# Patient Record
Sex: Male | Born: 1995 | Race: Black or African American | Hispanic: No | Marital: Single | Smoking: Never smoker
Health system: Southern US, Community
[De-identification: ages and names within clinical notes are randomized; demographics above are authoritative.]

---

## 2017-07-02 ENCOUNTER — Emergency Department (HOSPITAL_COMMUNITY)
Admission: EM | Admit: 2017-07-02 | Discharge: 2017-07-02 | Disposition: A | Discharge status: Home / Self Care | Payer: Self-pay | Attending: Emergency Medicine | Admitting: Emergency Medicine

## 2017-07-02 ENCOUNTER — Encounter (HOSPITAL_COMMUNITY): Payer: Self-pay | Admitting: Emergency Medicine

## 2017-07-02 DIAGNOSIS — R369 Urethral discharge, unspecified: Secondary | ICD-10-CM | POA: Insufficient documentation

## 2017-07-02 MED ORDER — CEFTRIAXONE SODIUM 250 MG IJ SOLR
250.0000 mg | Freq: Once | INTRAMUSCULAR | Status: AC
  Administered 2017-07-02: 250 mg via INTRAMUSCULAR
  Filled 2017-07-02: qty 250

## 2017-07-02 MED ORDER — LIDOCAINE HCL (PF) 1 % IJ SOLN
INTRAMUSCULAR | Status: AC
  Administered 2017-07-02: 0.9 mL
  Filled 2017-07-02: qty 5

## 2017-07-02 MED ORDER — AZITHROMYCIN 250 MG PO TABS
1000.0000 mg | ORAL_TABLET | Freq: Once | ORAL | Status: AC
  Administered 2017-07-02: 1000 mg via ORAL
  Filled 2017-07-02: qty 4

## 2017-07-02 NOTE — ED Provider Notes (Signed)
[facility] Note   CSN: [id number] Arrival date & time: 07/02/17  1717   History   Chief Complaint Chief Complaint  Patient presents with  . Penile Discharge    HPI Jesse SnareChristopher Hickman is a 21 y.o. male.  HPI    21 year old male reports 3 days of penile discharge and irritation. He denies any history of the same.  He reports his last sexual encounter was approximately 3 months ago with a male. Patient denies any abdominal pain fever nausea vomiting or any other systemic symptoms.   History reviewed. No pertinent past medical history.  There are no active problems to display for this patient.   History reviewed. No pertinent surgical history.     Home Medications    Prior to Admission medications   Not on File    Family History History reviewed. No pertinent family history.  Social History Social History   Tobacco Use  . Smoking status: Never Smoker  . Smokeless tobacco: Never Used  Substance Use Topics  . Alcohol use: No    Frequency: Never  . Drug use: No     Allergies   Patient has no allergy information on record.   Review of Systems Review of Systems  All other systems reviewed and are negative.    Physical Exam Updated Vital Signs BP (!) 143/71 (BP Location: Right Arm)   Pulse 78   Temp 98.1 F (36.7 C) (Oral)   Resp 17   Ht 5\' 8"  (1.727 m)   Wt 65.8 kg (145 lb)   SpO2 99%   BMI 22.05 kg/m   Physical Exam  Constitutional: He is oriented to person, place, and time. He appears well-developed and well-nourished.  HENT:  Head: Normocephalic and atraumatic.  Eyes: Conjunctivae are normal. Pupils are equal, round, and reactive to light. Right eye exhibits no discharge. Left eye exhibits no discharge. No scleral icterus.  Neck: Normal range of motion. No JVD present. No tracheal deviation present.  Pulmonary/Chest: Effort normal. No stridor.  Genitourinary:  Genitourinary Comments:  Purulent penile discharge no rashes  Neurological: He is alert and oriented to person, place, and time. Coordination normal.  Psychiatric: He has a normal mood and affect. His behavior is normal. Judgment and thought content normal.  Nursing note and vitals reviewed.    ED Treatments / Results  Labs (all labs ordered are listed, but only abnormal results are displayed) Labs Reviewed  RPR  HIV ANTIBODY (ROUTINE TESTING)  GC/CHLAMYDIA PROBE AMP ([place]) NOT AT [facility]    EKG  EKG Interpretation None       Radiology No results found.  Procedures Procedures (including critical care time)  Medications Ordered in ED Medications  cefTRIAXone (ROCEPHIN) injection 250 mg (250 mg Intramuscular Given 07/02/17 1931)  azithromycin (ZITHROMAX) tablet 1,000 mg (1,000 mg Oral Given 07/02/17 1930)  lidocaine (PF) (XYLOCAINE) 1 % injection (0.9 mLs  Given 07/02/17 1933)     Initial Impression / Assessment and Plan / ED Course  I have reviewed the triage vital signs and the nursing notes.  Pertinent labs & imaging results that were available during my care of the patient were reviewed by me and considered in my medical decision making (see chart for details).       Final Clinical Impressions(s) / ED Diagnoses   Final diagnoses:  Penile discharge   21 year old male presents today with likely STD. He is having purulent discharge. He'll be tested and treated appropriately. Return precautions  given. He verbalized understanding and agreement to today's plan.  ED Discharge Orders    None       Rosalio LoudHedges, Oretha Weismann, PA-C 07/02/17 2103    Benjiman CorePickering, Nathan, MD 07/02/17 [phone]

## 2017-07-02 NOTE — ED Triage Notes (Signed)
Pt to ER for evaluation of penile burning and white discharge onset Sunday. States not sexually active but reports frequent masturbation. States attempted to self treat with Vaseline. Pt in NAD. A/o x4

## 2017-07-02 NOTE — Discharge Instructions (Signed)
Please read attached information. If you experience any new or worsening signs or symptoms please return to the emergency room for evaluation. Please follow-up with your primary care provider or specialist as discussed.  °

## 2017-07-02 NOTE — ED Notes (Signed)
Patient given discharge instructions and verbalized understanding.  Patient stable to discharge at this time.  Patient is alert and oriented to baseline.  No distressed noted at this time.  All belongings taken with the patient at discharge.   

## 2017-07-03 LAB — RPR: RPR Ser Ql: NONREACTIVE

## 2017-07-03 LAB — HIV ANTIBODY (ROUTINE TESTING W REFLEX): HIV Screen 4th Generation wRfx: NONREACTIVE

## 2017-07-03 LAB — GC/CHLAMYDIA PROBE AMP (~~LOC~~) NOT AT ARMC
Chlamydia: POSITIVE — AB
Neisseria Gonorrhea: POSITIVE — AB

## 2017-12-06 ENCOUNTER — Encounter (HOSPITAL_COMMUNITY): Payer: Self-pay | Admitting: Family Medicine

## 2017-12-06 ENCOUNTER — Ambulatory Visit (HOSPITAL_COMMUNITY)
Admission: EM | Admit: 2017-12-06 | Discharge: 2017-12-06 | Disposition: A | Discharge status: Home / Self Care | Payer: Self-pay | Attending: Family Medicine | Admitting: Family Medicine

## 2017-12-06 DIAGNOSIS — H109 Unspecified conjunctivitis: Secondary | ICD-10-CM

## 2017-12-06 MED ORDER — GENTAMICIN SULFATE 0.3 % OP SOLN: 1.0000 [drp] | OPHTHALMIC | 0 refills | Status: DC

## 2017-12-06 NOTE — ED Provider Notes (Signed)
[facility]   [id number] 12/06/17 Arrival Time: 1645   SUBJECTIVE:  Jesse Hickman is a 22 y.o. male who presents to the urgent care with complaint of  Eye irritation on right side that started overnight  No contact use  Works in freezer with stocking food. History reviewed. No pertinent past medical history. No family history on file. Social History   Socioeconomic History  . Marital status: Single    Spouse name: Not on file  . Number of children: Not on file  . Years of education: Not on file  . Highest education level: Not on file  Occupational History  . Not on file  Social Needs  . Financial resource strain: Not on file  . Food insecurity:    Worry: Not on file    Inability: Not on file  . Transportation needs:    Medical: Not on file    Non-medical: Not on file  Tobacco Use  . Smoking status: Never Smoker  . Smokeless tobacco: Never Used  Substance and Sexual Activity  . Alcohol use: No    Frequency: Never  . Drug use: No  . Sexual activity: Not on file  Lifestyle  . Physical activity:    Days per week: Not on file    Minutes per session: Not on file  . Stress: Not on file  Relationships  . Social connections:    Talks on phone: Not on file    Gets together: Not on file    Attends religious service: Not on file    Active member of club or organization: Not on file    Attends meetings of clubs or organizations: Not on file    Relationship status: Not on file  . Intimate partner violence:    Fear of current or ex partner: Not on file    Emotionally abused: Not on file    Physically abused: Not on file    Forced sexual activity: Not on file  Other Topics Concern  . Not on file  Social History Narrative  . Not on file   No outpatient medications have been marked as taking for the 12/06/17 encounter [facility] Encounter).   No Known Allergies    ROS: As per HPI, remainder of ROS negative.   OBJECTIVE:   Vitals:   12/06/17 1724    BP: (!) 144/90  Pulse: 76  Resp: 20  Temp: 99.3 F (37.4 C)  TempSrc: Oral  SpO2: 99%     General appearance: alert; no distress Eyes: PERRL; EOMI; conjunctiva diffusely injected with swollen lower lid. HENT: normocephalic; atraumatic;oral mucosa normal  Neck: supple; no adenopathy Back: no CVA tenderness Extremities: no cyanosis or edema; symmetrical with no gross deformities Skin: warm and dry Neurologic: normal gait; grossly normal Psychological: alert and cooperative; normal mood and affect      Labs:  Results for orders placed or performed during the hospital encounter of 07/02/17  RPR  Result Value Ref Range   RPR Ser Ql Non Reactive Non Reactive  HIV antibody  Result Value Ref Range   HIV Screen 4th Generation wRfx Non Reactive Non Reactive  GC/Chlamydia probe amp ([place])not at [facility]  Result Value Ref Range   Chlamydia **POSITIVE** (A)    Neisseria gonorrhea **POSITIVE** (A)     Labs Reviewed - No data to display  No results found.     ASSESSMENT & PLAN:  1. Bacterial conjunctivitis of right eye     Meds ordered this encounter  Medications  .  gentamicin (GARAMYCIN) 0.3 % ophthalmic solution    Sig: Place 1 drop into the right eye every 4 (four) hours.    Dispense:  5 mL    Refill:  0    Reviewed expectations re: course of current medical issues. Questions answered. Outlined signs and symptoms indicating need for more acute intervention. Patient verbalized understanding. After Visit Summary given.    Procedures:      Elvina Sidle, MD 12/06/17 1729

## 2017-12-06 NOTE — ED Triage Notes (Signed)
Pt c/o R eye redness

## 2017-12-12 ENCOUNTER — Ambulatory Visit (HOSPITAL_COMMUNITY)
Admission: EM | Admit: 2017-12-12 | Discharge: 2017-12-12 | Disposition: A | Discharge status: Home / Self Care | Payer: Self-pay | Attending: Family Medicine | Admitting: Family Medicine

## 2017-12-12 ENCOUNTER — Encounter (HOSPITAL_COMMUNITY): Payer: Self-pay | Admitting: Emergency Medicine

## 2017-12-12 DIAGNOSIS — H1031 Unspecified acute conjunctivitis, right eye: Secondary | ICD-10-CM

## 2017-12-12 MED ORDER — AZITHROMYCIN 250 MG PO TABS
ORAL_TABLET | ORAL | Status: AC
  Filled 2017-12-12: qty 4

## 2017-12-12 MED ORDER — CEFTRIAXONE SODIUM 250 MG IJ SOLR
250.0000 mg | Freq: Once | INTRAMUSCULAR | Status: AC
  Administered 2017-12-12: 250 mg via INTRAMUSCULAR

## 2017-12-12 MED ORDER — CEFTRIAXONE SODIUM 250 MG IJ SOLR
INTRAMUSCULAR | Status: AC
Start: 2017-12-12 — End: ?
  Filled 2017-12-12: qty 250

## 2017-12-12 MED ORDER — AZITHROMYCIN 250 MG PO TABS
1000.0000 mg | ORAL_TABLET | Freq: Once | ORAL | Status: AC
  Administered 2017-12-12: 1000 mg via ORAL

## 2017-12-12 NOTE — ED Triage Notes (Signed)
PT's right eye is very red. Vision is blurry in affected eye. PT was seen here and prescribed gentamycin which did not help. 99.5 temp today.

## 2017-12-12 NOTE — Discharge Instructions (Addendum)
We are covering you for STD in the eye due to eye appearance. Eye culture also obtained for further testing. Azithromycin 1g by mouth and Rocephin  injection given in office today. Cytology sent, you will be contacted with any positive results that requires further treatment. Refrain from sexual activity and alcohol use for the next 7 days. If symptoms worsens during the weekend, please go to the emergency department for further evaluation needed. Otherwise, please follow up with ophthalmology for further evaluation and management needed.

## 2017-12-12 NOTE — ED Provider Notes (Signed)
[facility]    CSN: [id number] Arrival date & time: 12/12/17  1905     History   Chief Complaint Chief Complaint  Patient presents with  . Eye Problem    HPI Jesse Hickman is a 22 y.o. male.   22 year old male comes in for continued eye redness after being seen 12/06/2017.  At that time, he was treated for bacterial conjunctivitis with gentamicin.  States eyelid swelling has improved since then, but eye redness has continued with continued photophobia and eye irritation.  Vision is blurry on affected eye.  Denies crusting in the morning.  Denies contact lens, glasses use.    Last sexually active 2-3 months ago, 1 male partner, no condom use.      History reviewed. No pertinent past medical history.  There are no active problems to display for this patient.   History reviewed. No pertinent surgical history.     Home Medications    Prior to Admission medications   Medication Sig Start Date End Date Taking? Authorizing Provider  gentamicin (GARAMYCIN) 0.3 % ophthalmic solution Place 1 drop into the right eye every 4 (four) hours. 12/06/17  Yes Elvina Sidle, MD    Family History No family history on file.  Social History Social History   Tobacco Use  . Smoking status: Never Smoker  . Smokeless tobacco: Never Used  Substance Use Topics  . Alcohol use: No    Frequency: Never  . Drug use: No     Allergies   Patient has no known allergies.   Review of Systems Review of Systems  Reason unable to perform ROS: See HPI as above.     Physical Exam Triage Vital Signs ED Triage Vitals [12/12/17 1940]  Enc Vitals Group     BP 134/86     Pulse Rate 89     Resp 16     Temp 99.5 F (37.5 C)     Temp Source Oral     SpO2 100 %     Weight 145 lb (65.8 kg)     Height      Head Circumference      Peak Flow      Pain Score 6     Pain Loc      Pain Edu?      Excl. in GC?    No data found.  Updated Vital Signs BP 134/86   Pulse 89    Temp 99.5 F (37.5 C) (Oral)   Resp 16   Wt 145 lb (65.8 kg)   SpO2 100%   BMI 22.05 kg/m   Visual Acuity Right Eye Distance: 20/50 Left Eye Distance: 20/20 Bilateral Distance: 20/20  Right Eye Near:   Left Eye Near:    Bilateral Near:     Physical Exam  Constitutional: He is oriented to person, place, and time. He appears well-developed and well-nourished. No distress.  HENT:  Head: Normocephalic and atraumatic.  Eyes: Pupils are equal, round, and reactive to light. EOM and lids are normal. Lids are everted and swept, no foreign bodies found. Right conjunctiva is injected. Left conjunctiva is not injected.  Diffuse conjunctival injection, see picture below.  Patient with photophobia of right eye.  IOP (tonopen): 20 -- patient could not tolerate further tonopen.  Fluorescein stain without uptake.  Neck: Normal range of motion. Neck supple.  Neurological: He is alert and oriented to person, place, and time.  Skin: Skin is warm and dry.  UC Treatments / Results  Labs (all labs ordered are listed, but only abnormal results are displayed) Labs Reviewed  AEROBIC CULTURE (SUPERFICIAL SPECIMEN)  URINE CYTOLOGY ANCILLARY ONLY  CYTOLOGY, (ORAL, ANAL, URETHRAL) ANCILLARY ONLY    EKG None  Radiology No results found.  Procedures Procedures (including critical care time)  Medications Ordered in UC Medications  azithromycin (ZITHROMAX) tablet 1,000 mg (1,000 mg Oral Given 12/12/17 2058)  cefTRIAXone (ROCEPHIN) injection 250 mg (250 mg Intramuscular Given 12/12/17 2058)    Initial Impression / Assessment and Plan / UC Course  I have reviewed the triage vital signs and the nursing notes.  Pertinent labs & imaging results that were available during my care of the patient were reviewed by me and considered in my medical decision making (see chart for details).    Discussed case with Dr Lum Babe, who states concerns for [facility] of the conjunctiva. Cytology and  superficial culture obtained. Patient agreeable to empiric treatment of GC, azithromycin and rocephin given in office. Patient will need to follow up with ophthalmology for further evaluation and management needed. Strict return precautions given. Patient expresses understanding and agrees to plan.  Case discussed with Dr Marjorie Smolder, who agrees to plan.   Final Clinical Impressions(s) / UC Diagnoses   Final diagnoses:  Acute conjunctivitis of right eye, unspecified acute conjunctivitis type    ED Prescriptions    None        Belinda Fisher, PA-C 12/12/17 2111

## 2017-12-15 LAB — URINE CYTOLOGY ANCILLARY ONLY
CHLAMYDIA, DNA PROBE: NEGATIVE
Neisseria Gonorrhea: NEGATIVE
Trichomonas: NEGATIVE

## 2017-12-15 LAB — AEROBIC CULTURE  (SUPERFICIAL SPECIMEN)

## 2017-12-15 LAB — CYTOLOGY, (ORAL, ANAL, URETHRAL) ANCILLARY ONLY
CHLAMYDIA, DNA PROBE: NEGATIVE
NEISSERIA GONORRHEA: NEGATIVE

## 2017-12-15 LAB — AEROBIC CULTURE W GRAM STAIN (SUPERFICIAL SPECIMEN)
Culture: NO GROWTH
Gram Stain: NONE SEEN

## 2017-12-16 ENCOUNTER — Telehealth (HOSPITAL_COMMUNITY): Payer: Self-pay

## 2017-12-16 NOTE — Telephone Encounter (Signed)
All lab values are within normal limits. Pt contacted and made aware. Verbalized understanding.

## 2020-02-15 ENCOUNTER — Encounter (HOSPITAL_COMMUNITY): Payer: Self-pay

## 2020-02-15 ENCOUNTER — Ambulatory Visit (HOSPITAL_COMMUNITY)
Admission: EM | Admit: 2020-02-15 | Discharge: 2020-02-15 | Disposition: A | Discharge status: Home / Self Care | Payer: Managed Care, Other (non HMO) | Attending: Family Medicine | Admitting: Family Medicine

## 2020-02-15 ENCOUNTER — Other Ambulatory Visit: Payer: Self-pay

## 2020-02-15 DIAGNOSIS — Z202 Contact with and (suspected) exposure to infections with a predominantly sexual mode of transmission: Secondary | ICD-10-CM | POA: Diagnosis not present

## 2020-02-15 NOTE — Discharge Instructions (Addendum)
We will call you with any positive results of your swab. Follow up as needed for continued or worsening symptoms

## 2020-02-15 NOTE — ED Triage Notes (Signed)
Pt states he had unprotected sex last week and is concerned for possible exposure to STI.  Pt denies penile discharge, abdominal pain, dysuria sx. Pt states his penis has some skin is "peeling".

## 2020-02-16 LAB — CYTOLOGY, (ORAL, ANAL, URETHRAL) ANCILLARY ONLY
Chlamydia: POSITIVE — AB
Comment: NEGATIVE
Comment: NEGATIVE
Comment: NORMAL
Neisseria Gonorrhea: NEGATIVE
Trichomonas: NEGATIVE

## 2020-02-16 NOTE — ED Provider Notes (Signed)
[facility]    CSN: [id number] Arrival date & time: 02/15/20  1049      History   Chief Complaint Chief Complaint  Patient presents with  . Exposure to STD    HPI Jesse Hickman is a 24 y.o. male.   Pt is a 24 year old male that presents for STD screening.  Reports that he had unprotected sex last week and is concerned for possible stated STI.  Denies any penile discharge, abdominal pain, dysuria.     History reviewed. No pertinent past medical history.  There are no problems to display for this patient.   History reviewed. No pertinent surgical history.     Home Medications    Prior to Admission medications   Not on File    Family History Family History  Adopted: Yes    Social History Social History   Tobacco Use  . Smoking status: Never Smoker  . Smokeless tobacco: Never Used  Substance Use Topics  . Alcohol use: No  . Drug use: No     Allergies   Patient has no known allergies.   Review of Systems Review of Systems   Physical Exam Triage Vital Signs ED Triage Vitals  Enc Vitals Group     BP 02/15/20 1240 138/81     Pulse Rate 02/15/20 1240 70     Resp 02/15/20 1239 18     Temp 02/15/20 1239 98 F (36.7 C)     Temp Source 02/15/20 1239 Oral     SpO2 02/15/20 1240 100 %     Weight --      Height --      Head Circumference --      Peak Flow --      Pain Score 02/15/20 1240 0     Pain Loc --      Pain Edu? --      Excl. in GC? --    No data found.  Updated Vital Signs BP 138/81 (BP Location: Left Arm)   Pulse 70   Temp 98 F (36.7 C) (Oral)   Resp 18   SpO2 100%   Visual Acuity Right Eye Distance:   Left Eye Distance:   Bilateral Distance:    Right Eye Near:   Left Eye Near:    Bilateral Near:     Physical Exam Vitals and nursing note reviewed.  Constitutional:      Appearance: Normal appearance.  HENT:     Head: Normocephalic and atraumatic.     Nose: Nose normal.  Eyes:      Conjunctiva/sclera: Conjunctivae normal.  Pulmonary:     Effort: Pulmonary effort is normal.  Genitourinary:    Penis: Normal.   Musculoskeletal:        General: Normal range of motion.     Cervical back: Normal range of motion.  Skin:    General: Skin is warm and dry.  Neurological:     Mental Status: He is alert.  Psychiatric:        Mood and Affect: Mood normal.      UC Treatments / Results  Labs (all labs ordered are listed, but only abnormal results are displayed) Labs Reviewed  CYTOLOGY, (ORAL, ANAL, URETHRAL) ANCILLARY ONLY    EKG   Radiology No results found.  Procedures Procedures (including critical care time)  Medications Ordered in UC Medications - No data to display  Initial Impression / Assessment and Plan / UC Course  I have reviewed the triage  vital signs and the nursing notes.  Pertinent labs & imaging results that were available during my care of the patient were reviewed by me and considered in my medical decision making (see chart for details).     Exposure to STD Swab sent for testing.  Normal exam Final Clinical Impressions(s) / UC Diagnoses   Final diagnoses:  Exposure to STD     Discharge Instructions     We will call you with any positive results of your swab. Follow up as needed for continued or worsening symptoms     ED Prescriptions    None     PDMP not reviewed this encounter.   Dahlia Byes A, NP 02/16/20 0840

## 2020-02-18 ENCOUNTER — Telehealth (HOSPITAL_COMMUNITY): Payer: Self-pay

## 2020-02-18 MED ORDER — AZITHROMYCIN 250 MG PO TABS
1000.0000 mg | ORAL_TABLET | Freq: Once | ORAL | 0 refills | Status: AC
Start: 2020-02-18 — End: 2020-02-18

## 2020-03-31 ENCOUNTER — Encounter (HOSPITAL_COMMUNITY): Payer: Self-pay

## 2020-03-31 ENCOUNTER — Other Ambulatory Visit: Payer: Self-pay

## 2020-03-31 ENCOUNTER — Ambulatory Visit (HOSPITAL_COMMUNITY)
Admission: EM | Admit: 2020-03-31 | Discharge: 2020-03-31 | Disposition: A | Discharge status: Home / Self Care | Payer: Managed Care, Other (non HMO) | Attending: Physician Assistant | Admitting: Physician Assistant

## 2020-03-31 DIAGNOSIS — R369 Urethral discharge, unspecified: Secondary | ICD-10-CM | POA: Insufficient documentation

## 2020-03-31 DIAGNOSIS — Z8619 Personal history of other infectious and parasitic diseases: Secondary | ICD-10-CM | POA: Diagnosis present

## 2020-03-31 LAB — POCT URINALYSIS DIPSTICK, ED / UC
Bilirubin Urine: NEGATIVE
Glucose, UA: NEGATIVE mg/dL
Ketones, ur: NEGATIVE mg/dL
Nitrite: NEGATIVE
Protein, ur: NEGATIVE mg/dL
Specific Gravity, Urine: 1.02 (ref 1.005–1.030)
Urobilinogen, UA: >=8 mg/dL (ref 0.0–1.0)
pH: 7 (ref 5.0–8.0)

## 2020-03-31 MED ORDER — DOXYCYCLINE HYCLATE 100 MG PO CAPS: 100.0000 mg | ORAL_CAPSULE | Freq: Two times a day (BID) | ORAL | 0 refills | Status: AC

## 2020-03-31 NOTE — ED Provider Notes (Signed)
[facility]    CSN: [id number] Arrival date & time: 03/31/20  5643      History   Chief Complaint Chief Complaint  Jesse Hickman presents with  . penile discharge    HPI Jesse Hickman is a 24 y.o. male.   Presents for urethral discharge.  Discharge been present for the last 3 days.  He denies pain.  Denies testicular swelling or tenderness.  He reports he had a chlamydial infection on 02/15/2020, completed his azithromycin treatment at the time.  He has not been sexually active since then.  He does note changing soaps recently.  No fevers, chills.  No abdominal pain.     History reviewed. No pertinent past medical history.  There are no problems to display for this Jesse Hickman.   History reviewed. No pertinent surgical history.     Home Medications    Prior to Admission medications   Medication Sig Start Date End Date Taking? Authorizing Provider  doxycycline (VIBRAMYCIN) 100 MG capsule Take 1 capsule (100 mg total) by mouth 2 (two) times daily for 7 days. 03/31/20 04/07/20  Celestine Bougie, Veryl Speak, PA-C    Family History Family History  Adopted: Yes    Social History Social History   Tobacco Use  . Smoking status: Never Smoker  . Smokeless tobacco: Never Used  Substance Use Topics  . Alcohol use: No  . Drug use: No     Allergies   Jesse Hickman has no known allergies.   Review of Systems Review of Systems   Physical Exam Triage Vital Signs ED Triage Vitals  Enc Vitals Group     BP      Pulse      Resp      Temp      Temp src      SpO2      Weight      Height      Head Circumference      Peak Flow      Pain Score      Pain Loc      Pain Edu?      Excl. in GC?    No data found.  Updated Vital Signs BP 129/74   Pulse 63   Temp 98.3 F (36.8 C) (Oral)   Resp 18   Ht 5\' 8"  (1.727 m)   Wt 150 lb (68 kg)   SpO2 95%   BMI 22.81 kg/m   Visual Acuity Right Eye Distance:   Left Eye Distance:   Bilateral Distance:    Right Eye Near:     Left Eye Near:    Bilateral Near:     Physical Exam Vitals and nursing note reviewed.  Constitutional:      General: He is not in acute distress.    Appearance: Normal appearance. He is not ill-appearing.  Cardiovascular:     Rate and Rhythm: Normal rate.  Pulmonary:     Effort: Pulmonary effort is normal. No respiratory distress.  Genitourinary:    Comments: Clear with white streaking urethral discharge present at the meatus.  No skin lesions.  No testicular swelling or tenderness.  No inguinal adenopathy. Musculoskeletal:     Right lower leg: No edema.     Left lower leg: No edema.  Neurological:     Mental Status: He is alert.      UC Treatments / Results  Labs (all labs ordered are listed, but only abnormal results are displayed) Labs Reviewed  POCT URINALYSIS DIPSTICK, ED /  UC - Abnormal; Notable for the following components:      Result Value   Hgb urine dipstick TRACE (*)    Leukocytes,Ua SMALL (*)    All other components within normal limits  URINE CULTURE  CYTOLOGY, (ORAL, ANAL, URETHRAL) ANCILLARY ONLY    EKG   Radiology No results found.  Procedures Procedures (including critical care time)  Medications Ordered in UC Medications - No data to display  Initial Impression / Assessment and Plan / UC Course  I have reviewed the triage vital signs and the nursing notes.  Pertinent labs & imaging results that were available during my care of the Jesse Hickman were reviewed by me and considered in my medical decision making (see chart for details).     #Recent history of chlamydia #Urethral discharge Jesse Hickman is 24 year old with recent chlamydial infection presenting with urethral discharge.  UA with trace blood and leukocytes.  Culture sent.  Swab sent.  No he did receive treatment for the chlamydial infection, will start on doxycycline she received azithromycin previously.  Will defer other treatments pending results.  Discussed this with Jesse Hickman who  verbalized agreement and understanding that plan.  Encouraged him to follow-up with his primary care next week.  Jesse Hickman verbalized understanding plan of care. Final Clinical Impressions(s) / UC Diagnoses   Final diagnoses:  History of chlamydia  Urethral discharge in male     Discharge Instructions     Take the medication as prescribed  We have sent your tests we will call if we need to change anything  Continue to abstain from sexual intercourse , until all results return and treatments are complete  Minimize new soap products  Follow up with your primary care next week for check up     ED Prescriptions    Medication Sig Dispense Auth. Provider   doxycycline (VIBRAMYCIN) 100 MG capsule Take 1 capsule (100 mg total) by mouth 2 (two) times daily for 7 days. 14 capsule Zoriana Oats, Veryl Speak, PA-C     PDMP not reviewed this encounter.   Hermelinda Medicus, PA-C 03/31/20 [phone]

## 2020-03-31 NOTE — ED Triage Notes (Signed)
Pt c/o clear penile dischargex3 days. PT states completed meds for recent chlamydia dx and hasn't had sex since he took the meds.

## 2020-03-31 NOTE — Discharge Instructions (Signed)
Take the medication as prescribed  We have sent your tests we will call if we need to change anything  Continue to abstain from sexual intercourse , until all results return and treatments are complete  Minimize new soap products  Follow up with your primary care next week for check up

## 2020-04-03 LAB — CYTOLOGY, (ORAL, ANAL, URETHRAL) ANCILLARY ONLY
Chlamydia: POSITIVE — AB
Comment: NEGATIVE
Comment: NEGATIVE
Comment: NORMAL
Neisseria Gonorrhea: NEGATIVE
Trichomonas: NEGATIVE

## 2020-10-21 ENCOUNTER — Ambulatory Visit (INDEPENDENT_AMBULATORY_CARE_PROVIDER_SITE_OTHER): Payer: Self-pay

## 2020-10-21 ENCOUNTER — Ambulatory Visit (HOSPITAL_COMMUNITY)
Admission: EM | Admit: 2020-10-21 | Discharge: 2020-10-21 | Disposition: A | Discharge status: Home / Self Care | Payer: Self-pay | Attending: Urgent Care | Admitting: Urgent Care

## 2020-10-21 ENCOUNTER — Encounter (HOSPITAL_COMMUNITY): Payer: Self-pay

## 2020-10-21 ENCOUNTER — Other Ambulatory Visit: Payer: Self-pay

## 2020-10-21 DIAGNOSIS — S93402A Sprain of unspecified ligament of left ankle, initial encounter: Secondary | ICD-10-CM

## 2020-10-21 DIAGNOSIS — M25572 Pain in left ankle and joints of left foot: Secondary | ICD-10-CM

## 2020-10-21 DIAGNOSIS — M25472 Effusion, left ankle: Secondary | ICD-10-CM

## 2020-10-21 MED ORDER — NAPROXEN 500 MG PO TABS: 500.0000 mg | ORAL_TABLET | Freq: Two times a day (BID) | ORAL | 0 refills | Status: AC

## 2020-10-21 NOTE — ED Triage Notes (Signed)
Pt presents with left foot pain after stepping down on it the wrong way at work

## 2020-10-21 NOTE — ED Provider Notes (Signed)
[facility]   MRN: [id number] DOB: March 01, 1996  Subjective:   Jesse Hickman is a 25 y.o. male presenting for 1 day history of acute onset left ankle swelling, difficulty bearing weight.  Patient states that he was at work, stepped down off of the stepped the wrong way and rolled his ankle laterally.  He has been able to bear weight but has severe sharp pains intermittently.  Has not needed to take any medications for his pain.  Reports that it is otherwise mild.   No current facility-administered medications for this encounter. No current outpatient medications on file.   No Known Allergies  History reviewed. No pertinent past medical history.   History reviewed. No pertinent surgical history.  Family History  Adopted: Yes    Social History   Tobacco Use  . Smoking status: Never Smoker  . Smokeless tobacco: Never Used  Substance Use Topics  . Alcohol use: No  . Drug use: No    ROS   Objective:   Vitals: BP 127/69 (BP Location: Right Arm)   Pulse 63   Temp 97.8 F (36.6 C) (Oral)   Resp 18   SpO2 96%   Physical Exam Constitutional:      General: He is not in acute distress.    Appearance: Normal appearance. He is well-developed and normal weight. He is not ill-appearing, toxic-appearing or diaphoretic.  HENT:     Head: Normocephalic and atraumatic.     Right Ear: External ear normal.     Left Ear: External ear normal.     Nose: Nose normal.     Mouth/Throat:     Pharynx: Oropharynx is clear.  Eyes:     General: No scleral icterus.       Right eye: No discharge.        Left eye: No discharge.     Extraocular Movements: Extraocular movements intact.     Pupils: Pupils are equal, round, and reactive to light.  Cardiovascular:     Rate and Rhythm: Normal rate.  Pulmonary:     Effort: Pulmonary effort is normal.  Musculoskeletal:     Cervical back: Normal range of motion.     Left ankle: Swelling present. No deformity, ecchymosis  or lacerations. Tenderness present over the lateral malleolus. No medial malleolus, ATF ligament, AITF ligament, CF ligament, posterior TF ligament, base of 5th metatarsal or proximal fibula tenderness. Normal range of motion.     Left Achilles Tendon: No tenderness or defects. Thompson's test negative.  Neurological:     Mental Status: He is alert and oriented to person, place, and time.  Psychiatric:        Mood and Affect: Mood normal.        Behavior: Behavior normal.        Thought Content: Thought content normal.        Judgment: Judgment normal.     DG Ankle Complete Left  Result Date: 10/21/2020 CLINICAL DATA:  Ankle injury EXAM: LEFT ANKLE COMPLETE - 3+ VIEW COMPARISON:  None. FINDINGS: There is no evidence of fracture, dislocation, or joint effusion. There is no evidence of arthropathy or other focal bone abnormality. Soft tissues are unremarkable. IMPRESSION: Negative. Electronically Signed   By: Charlett Nose M.D.   On: 10/21/2020 16:30   Left ankle wrapped using 4" Ace wrap in figure-8 method.   Assessment and Plan :   PDMP not reviewed this encounter.  1. Sprain of left ankle, unspecified ligament, initial  encounter   2. Pain and swelling of left ankle     Will manage for ankle sprain with rice method, NSAID. Counseled patient on potential for adverse effects with medications prescribed/recommended today, ER and return-to-clinic precautions discussed, patient verbalized understanding.    Wallis Bamberg, [place] 10/21/20 1713

## 2021-04-09 ENCOUNTER — Encounter (HOSPITAL_COMMUNITY): Payer: Self-pay | Admitting: Emergency Medicine

## 2021-04-09 ENCOUNTER — Ambulatory Visit (HOSPITAL_COMMUNITY)
Admission: EM | Admit: 2021-04-09 | Discharge: 2021-04-09 | Disposition: A | Discharge status: Home / Self Care | Payer: Self-pay | Attending: Physician Assistant | Admitting: Physician Assistant

## 2021-04-09 ENCOUNTER — Other Ambulatory Visit: Payer: Self-pay

## 2021-04-09 DIAGNOSIS — Z113 Encounter for screening for infections with a predominantly sexual mode of transmission: Secondary | ICD-10-CM | POA: Insufficient documentation

## 2021-04-09 NOTE — ED Provider Notes (Signed)
[facility]    CSN: [id number] Arrival date & time: 04/09/21  1433      History   Chief Complaint Chief Complaint  Patient presents with   Erectile Dysfunction    HPI Jesse Hickman is a 25 y.o. male.   Pt presents for STD screening.  He reports he has not been having as many erections as normal and is concerned it may be due to STD.  Denies discharge, penile pain, dysuria, testicular pain/swelling.  He is a non smoker.    No past medical history on file.  There are no problems to display for this patient.   No past surgical history on file.     Home Medications    Prior to Admission medications   Medication Sig Start Date End Date Taking? Authorizing Provider  naproxen (NAPROSYN) 500 MG tablet Take 1 tablet (500 mg total) by mouth 2 (two) times daily with a meal. 10/21/20   Wallis Bamberg, PA-C    Family History Family History  Adopted: Yes    Social History Social History   Tobacco Use   Smoking status: Never   Smokeless tobacco: Never  Substance Use Topics   Alcohol use: No   Drug use: No     Allergies   Patient has no known allergies.   Review of Systems Review of Systems  Constitutional:  Negative for chills and fever.  HENT:  Negative for ear pain and sore throat.   Eyes:  Negative for pain and visual disturbance.  Respiratory:  Negative for cough and shortness of breath.   Cardiovascular:  Negative for chest pain and palpitations.  Gastrointestinal:  Negative for abdominal pain and vomiting.  Genitourinary:  Negative for dysuria, hematuria, penile discharge, penile pain, penile swelling, scrotal swelling and testicular pain.  Musculoskeletal:  Negative for arthralgias and back pain.  Skin:  Negative for color change and rash.  Neurological:  Negative for seizures and syncope.  All other systems reviewed and are negative.   Physical Exam Triage Vital Signs ED Triage Vitals [04/09/21 1537]  Enc Vitals Group     BP 132/83      Pulse Rate (!) 55     Resp 18     Temp      Temp src      SpO2 98 %     Weight      Height      Head Circumference      Peak Flow      Pain Score 0     Pain Loc      Pain Edu?      Excl. in GC?    No data found.  Updated Vital Signs BP 132/83 (BP Location: Right Arm)   Pulse (!) 55   Resp 18   SpO2 98%   Visual Acuity Right Eye Distance:   Left Eye Distance:   Bilateral Distance:    Right Eye Near:   Left Eye Near:    Bilateral Near:     Physical Exam Vitals and nursing note reviewed.  Constitutional:      Appearance: He is well-developed.  HENT:     Head: Normocephalic and atraumatic.  Eyes:     Conjunctiva/sclera: Conjunctivae normal.  Cardiovascular:     Rate and Rhythm: Normal rate and regular rhythm.     Heart sounds: No murmur heard. Pulmonary:     Effort: Pulmonary effort is normal. No respiratory distress.     Breath sounds: Normal breath sounds.  Abdominal:     Palpations: Abdomen is soft.     Tenderness: There is no abdominal tenderness.  Musculoskeletal:     Cervical back: Neck supple.  Skin:    General: Skin is warm and dry.  Neurological:     Mental Status: He is alert.     UC Treatments / Results  Labs (all labs ordered are listed, but only abnormal results are displayed) Labs Reviewed - No data to display  EKG   Radiology No results found.  Procedures Procedures (including critical care time)  Medications Ordered in UC Medications - No data to display  Initial Impression / Assessment and Plan / UC Course  I have reviewed the triage vital signs and the nursing notes.  Pertinent labs & imaging results that were available during my care of the patient were reviewed by me and considered in my medical decision making (see chart for details).     Will screen for STDs. Advised to follow up with PCP if no improvement.  Final Clinical Impressions(s) / UC Diagnoses   Final diagnoses:  None   Discharge Instructions   None     ED Prescriptions   None    PDMP not reviewed this encounter.   Ward, Tylene Fantasia, PA-C 04/09/21 [phone]

## 2021-04-09 NOTE — Discharge Instructions (Addendum)
Will call with test results and treat if needed Follow up with primary care if symptoms persists

## 2021-04-09 NOTE — ED Triage Notes (Signed)
Patient presents t [facility] for evaluation after having sex on Tuesday of last week.  States he is now not getting as many erections as he normally does, and its been many years snce his last partner.

## 2021-04-10 LAB — CYTOLOGY, (ORAL, ANAL, URETHRAL) ANCILLARY ONLY
Chlamydia: NEGATIVE
Comment: NEGATIVE
Comment: NEGATIVE
Comment: NORMAL
Neisseria Gonorrhea: NEGATIVE
Trichomonas: NEGATIVE

## 2022-01-27 IMAGING — DX DG ANKLE COMPLETE 3+V*L*
3 series · 3 of 3 positions shown · non-contrast
Comparison: None.

CLINICAL DATA: Ankle injury

EXAM:
LEFT ANKLE COMPLETE - 3+ VIEW

[ankle ap]
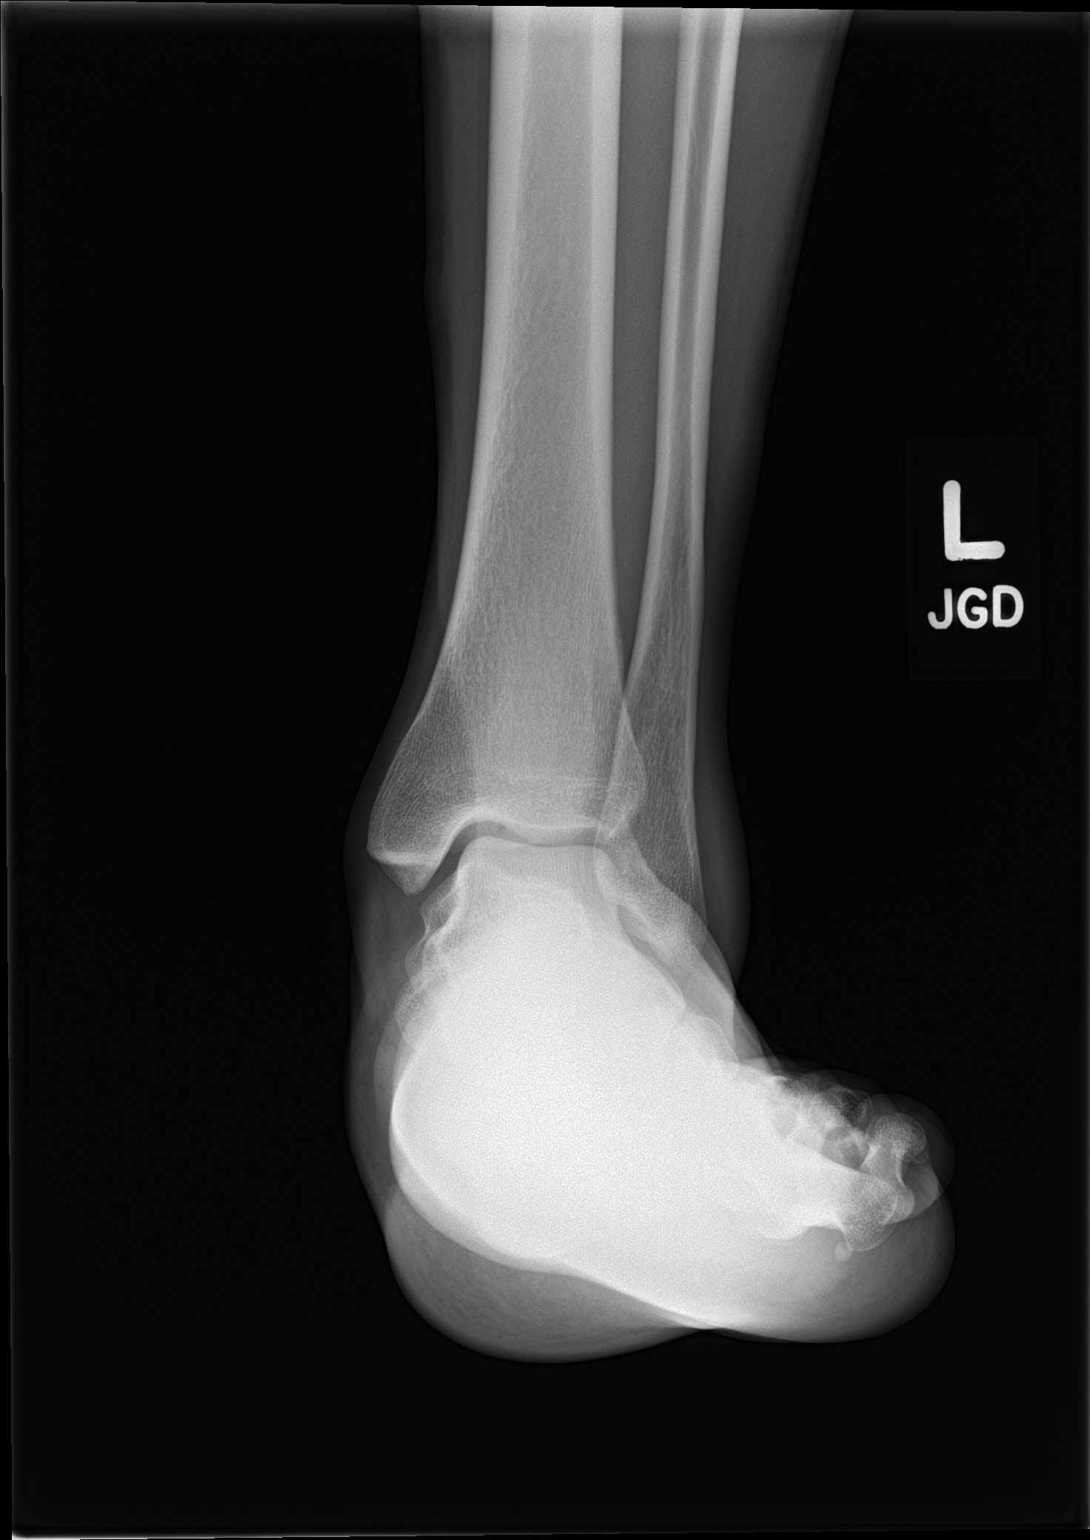

[ankle obl]
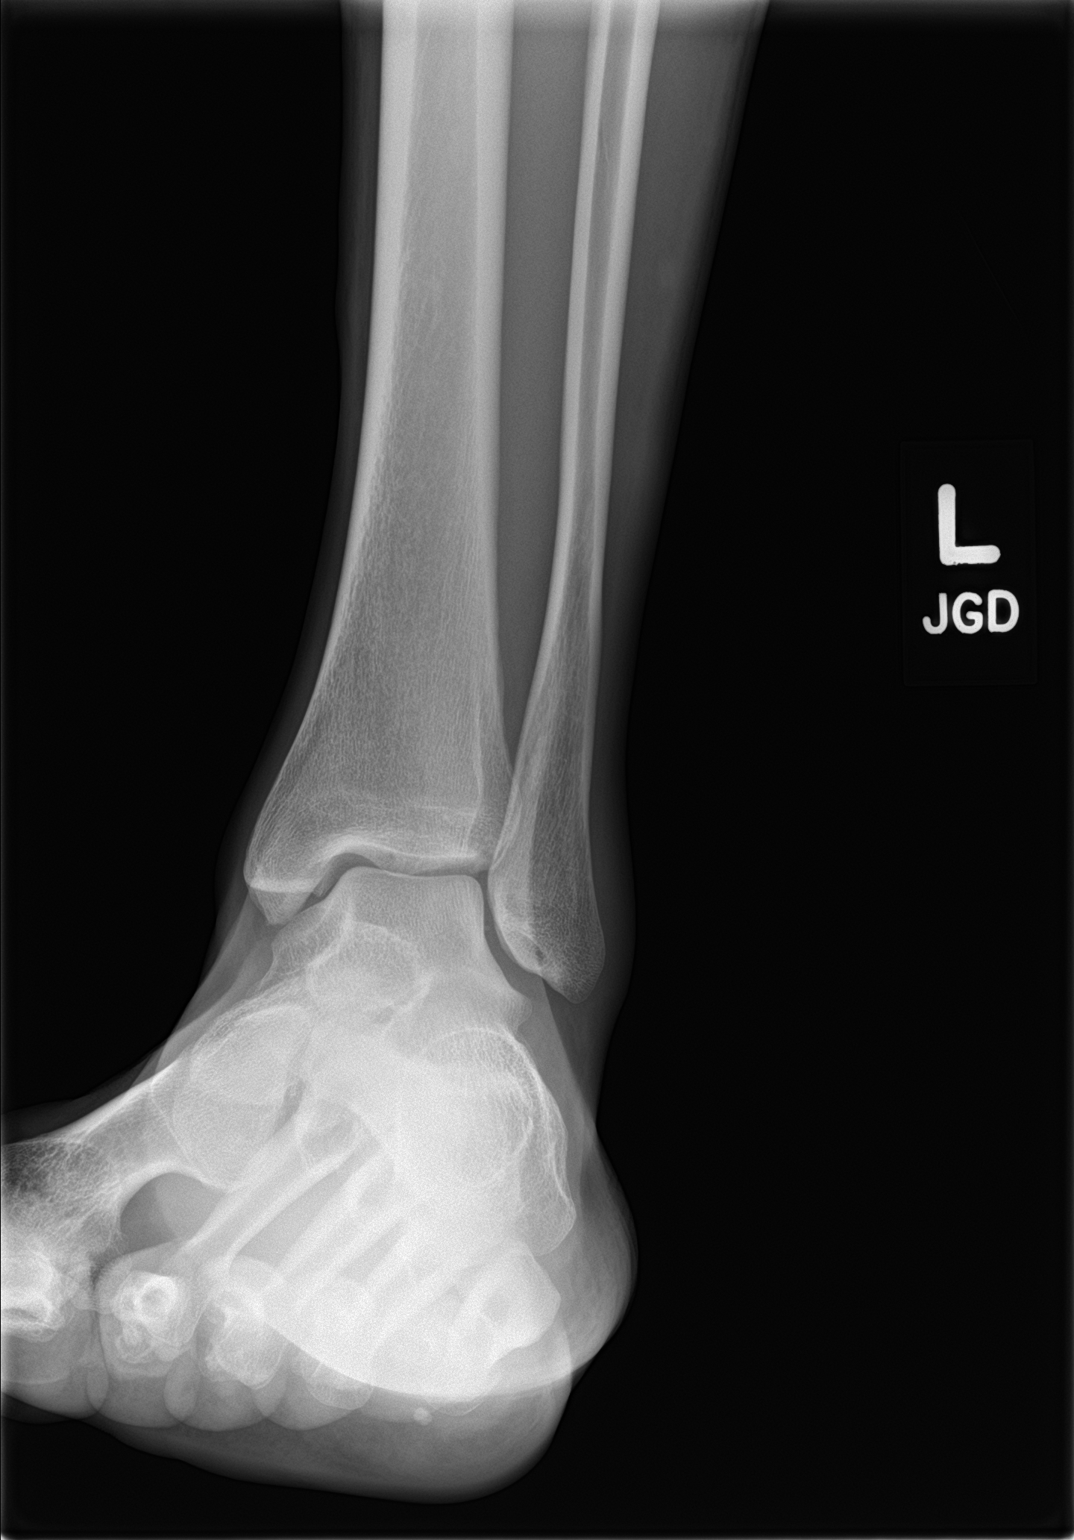

[ankle lat]
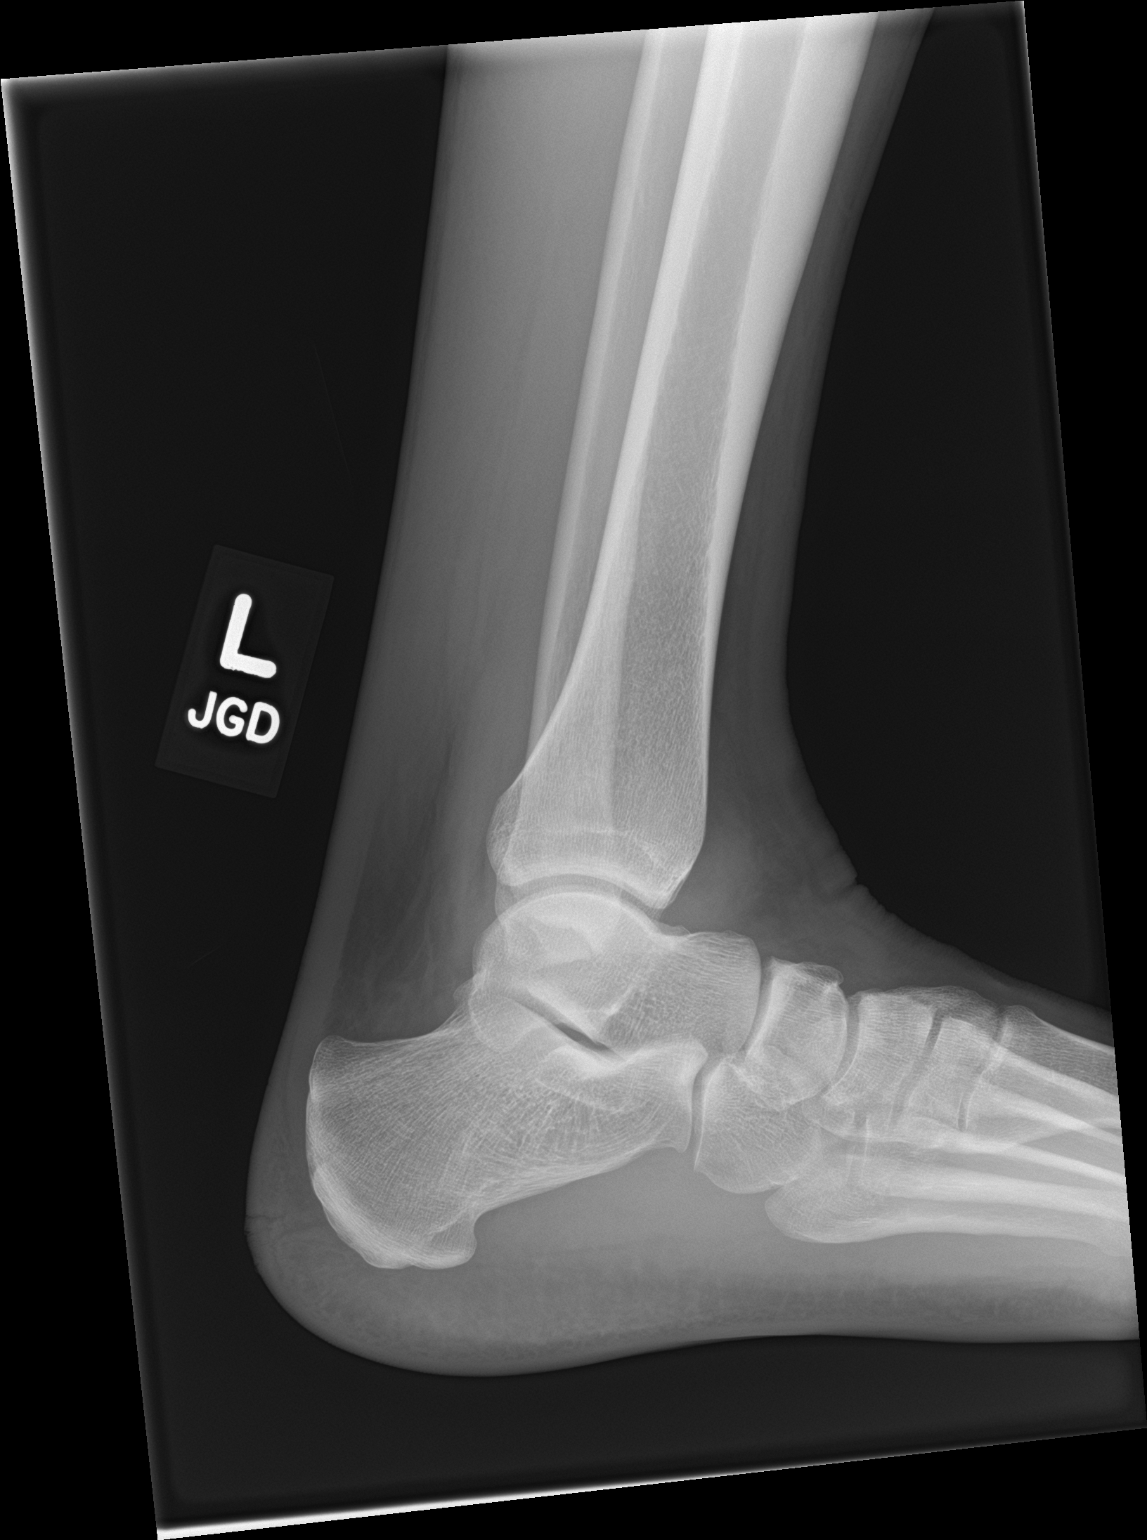

[3 of 3 positions shown; findings below may reference images not displayed]

FINDINGS: There is no evidence of fracture, dislocation, or joint effusion.
There is no evidence of arthropathy or other focal bone abnormality.
Soft tissues are unremarkable.
IMPRESSION: Negative.
